# Patient Record
Sex: Female | Born: 1965 | Race: White | Hispanic: No | Marital: Married
Health system: Southern US, Community
[De-identification: ages and names within clinical notes are randomized; demographics above are authoritative.]

## PROBLEM LIST (undated history)

## (undated) HISTORY — PX: BREAST BIOPSY: SHX20

## (undated) HISTORY — PX: BREAST EXCISIONAL BIOPSY: SUR124

---

## 2014-06-15 ENCOUNTER — Other Ambulatory Visit: Payer: Self-pay

## 2014-06-15 DIAGNOSIS — R921 Mammographic calcification found on diagnostic imaging of breast: Secondary | ICD-10-CM

## 2014-06-17 ENCOUNTER — Other Ambulatory Visit: Payer: Self-pay | Admitting: Obstetrics and Gynecology

## 2014-06-17 ENCOUNTER — Other Ambulatory Visit: Payer: Self-pay

## 2014-06-17 DIAGNOSIS — R921 Mammographic calcification found on diagnostic imaging of breast: Secondary | ICD-10-CM

## 2014-06-25 ENCOUNTER — Encounter (INDEPENDENT_AMBULATORY_CARE_PROVIDER_SITE_OTHER): Payer: Self-pay

## 2014-06-25 ENCOUNTER — Ambulatory Visit
Admission: RE | Admit: 2014-06-25 | Discharge: 2014-06-25 | Disposition: A | Discharge status: Home / Self Care | Payer: 59 | Source: Ambulatory Visit

## 2014-06-25 DIAGNOSIS — R921 Mammographic calcification found on diagnostic imaging of breast: Secondary | ICD-10-CM

## 2014-10-22 ENCOUNTER — Other Ambulatory Visit: Payer: Self-pay

## 2014-10-22 DIAGNOSIS — Z1231 Encounter for screening mammogram for malignant neoplasm of breast: Secondary | ICD-10-CM

## 2014-11-02 ENCOUNTER — Ambulatory Visit
Admission: RE | Admit: 2014-11-02 | Discharge: 2014-11-02 | Disposition: A | Discharge status: Home / Self Care | Payer: BLUE CROSS/BLUE SHIELD | Source: Ambulatory Visit

## 2014-11-02 DIAGNOSIS — Z1231 Encounter for screening mammogram for malignant neoplasm of breast: Secondary | ICD-10-CM

## 2015-10-19 ENCOUNTER — Other Ambulatory Visit: Payer: Self-pay

## 2015-10-19 DIAGNOSIS — Z1231 Encounter for screening mammogram for malignant neoplasm of breast: Secondary | ICD-10-CM

## 2015-11-09 ENCOUNTER — Ambulatory Visit
Admission: RE | Admit: 2015-11-09 | Discharge: 2015-11-09 | Disposition: A | Discharge status: Home / Self Care | Payer: Managed Care, Other (non HMO) | Source: Ambulatory Visit

## 2015-11-09 DIAGNOSIS — Z1231 Encounter for screening mammogram for malignant neoplasm of breast: Secondary | ICD-10-CM

## 2016-10-18 ENCOUNTER — Other Ambulatory Visit: Payer: Self-pay | Admitting: Obstetrics

## 2016-10-18 DIAGNOSIS — Z1231 Encounter for screening mammogram for malignant neoplasm of breast: Secondary | ICD-10-CM

## 2016-10-18 DIAGNOSIS — D225 Melanocytic nevi of trunk: Secondary | ICD-10-CM | POA: Diagnosis not present

## 2016-10-30 DIAGNOSIS — E039 Hypothyroidism, unspecified: Secondary | ICD-10-CM | POA: Diagnosis not present

## 2016-10-30 DIAGNOSIS — I10 Essential (primary) hypertension: Secondary | ICD-10-CM | POA: Diagnosis not present

## 2016-11-10 ENCOUNTER — Ambulatory Visit
Admission: RE | Admit: 2016-11-10 | Discharge: 2016-11-10 | Disposition: A | Discharge status: Home / Self Care | Payer: 59 | Source: Ambulatory Visit | Attending: Obstetrics | Admitting: Obstetrics

## 2016-11-10 DIAGNOSIS — Z1231 Encounter for screening mammogram for malignant neoplasm of breast: Secondary | ICD-10-CM

## 2016-11-30 DIAGNOSIS — L03012 Cellulitis of left finger: Secondary | ICD-10-CM | POA: Diagnosis not present

## 2017-02-26 DIAGNOSIS — Z6824 Body mass index (BMI) 24.0-24.9, adult: Secondary | ICD-10-CM | POA: Diagnosis not present

## 2017-02-26 DIAGNOSIS — Z01419 Encounter for gynecological examination (general) (routine) without abnormal findings: Secondary | ICD-10-CM | POA: Diagnosis not present

## 2017-06-22 DIAGNOSIS — H04123 Dry eye syndrome of bilateral lacrimal glands: Secondary | ICD-10-CM | POA: Diagnosis not present

## 2017-07-12 DIAGNOSIS — D225 Melanocytic nevi of trunk: Secondary | ICD-10-CM | POA: Diagnosis not present

## 2017-07-12 DIAGNOSIS — B078 Other viral warts: Secondary | ICD-10-CM | POA: Diagnosis not present

## 2017-07-12 DIAGNOSIS — D2261 Melanocytic nevi of right upper limb, including shoulder: Secondary | ICD-10-CM | POA: Diagnosis not present

## 2017-07-12 DIAGNOSIS — D485 Neoplasm of uncertain behavior of skin: Secondary | ICD-10-CM | POA: Diagnosis not present

## 2017-07-12 DIAGNOSIS — Z86018 Personal history of other benign neoplasm: Secondary | ICD-10-CM | POA: Diagnosis not present

## 2017-07-12 DIAGNOSIS — Z86008 Personal history of in-situ neoplasm of other site: Secondary | ICD-10-CM | POA: Diagnosis not present

## 2017-07-21 DIAGNOSIS — Z23 Encounter for immunization: Secondary | ICD-10-CM | POA: Diagnosis not present

## 2017-10-22 ENCOUNTER — Other Ambulatory Visit: Payer: Self-pay | Admitting: Obstetrics

## 2017-10-22 DIAGNOSIS — Z139 Encounter for screening, unspecified: Secondary | ICD-10-CM

## 2017-10-29 DIAGNOSIS — Z0000 Encounter for general adult medical examination without abnormal findings: Secondary | ICD-10-CM | POA: Diagnosis not present

## 2017-10-29 DIAGNOSIS — K219 Gastro-esophageal reflux disease without esophagitis: Secondary | ICD-10-CM | POA: Diagnosis not present

## 2017-10-29 DIAGNOSIS — E039 Hypothyroidism, unspecified: Secondary | ICD-10-CM | POA: Diagnosis not present

## 2017-10-29 DIAGNOSIS — I10 Essential (primary) hypertension: Secondary | ICD-10-CM | POA: Diagnosis not present

## 2017-11-12 ENCOUNTER — Ambulatory Visit: Payer: 59

## 2017-11-16 ENCOUNTER — Ambulatory Visit
Admission: RE | Admit: 2017-11-16 | Discharge: 2017-11-16 | Disposition: A | Discharge status: Home / Self Care | Payer: 59 | Source: Ambulatory Visit | Attending: Obstetrics | Admitting: Obstetrics

## 2017-11-16 DIAGNOSIS — Z1231 Encounter for screening mammogram for malignant neoplasm of breast: Secondary | ICD-10-CM | POA: Diagnosis not present

## 2017-11-16 DIAGNOSIS — Z139 Encounter for screening, unspecified: Secondary | ICD-10-CM

## 2017-12-11 DIAGNOSIS — D485 Neoplasm of uncertain behavior of skin: Secondary | ICD-10-CM | POA: Diagnosis not present

## 2017-12-11 DIAGNOSIS — D2261 Melanocytic nevi of right upper limb, including shoulder: Secondary | ICD-10-CM | POA: Diagnosis not present

## 2018-01-03 DIAGNOSIS — D2261 Melanocytic nevi of right upper limb, including shoulder: Secondary | ICD-10-CM | POA: Diagnosis not present

## 2018-01-03 DIAGNOSIS — Z86008 Personal history of in-situ neoplasm of other site: Secondary | ICD-10-CM | POA: Diagnosis not present

## 2018-01-03 DIAGNOSIS — Z86018 Personal history of other benign neoplasm: Secondary | ICD-10-CM | POA: Diagnosis not present

## 2018-01-04 DIAGNOSIS — E039 Hypothyroidism, unspecified: Secondary | ICD-10-CM | POA: Diagnosis not present

## 2018-02-28 DIAGNOSIS — Z6824 Body mass index (BMI) 24.0-24.9, adult: Secondary | ICD-10-CM | POA: Diagnosis not present

## 2018-02-28 DIAGNOSIS — Z01419 Encounter for gynecological examination (general) (routine) without abnormal findings: Secondary | ICD-10-CM | POA: Diagnosis not present

## 2018-04-05 DIAGNOSIS — Z23 Encounter for immunization: Secondary | ICD-10-CM | POA: Diagnosis not present

## 2018-05-20 DIAGNOSIS — K635 Polyp of colon: Secondary | ICD-10-CM | POA: Diagnosis not present

## 2018-07-06 DIAGNOSIS — Z23 Encounter for immunization: Secondary | ICD-10-CM | POA: Diagnosis not present

## 2018-08-01 DIAGNOSIS — D485 Neoplasm of uncertain behavior of skin: Secondary | ICD-10-CM | POA: Diagnosis not present

## 2018-08-01 DIAGNOSIS — D225 Melanocytic nevi of trunk: Secondary | ICD-10-CM | POA: Diagnosis not present

## 2018-08-01 DIAGNOSIS — D2261 Melanocytic nevi of right upper limb, including shoulder: Secondary | ICD-10-CM | POA: Diagnosis not present

## 2018-08-01 DIAGNOSIS — D2271 Melanocytic nevi of right lower limb, including hip: Secondary | ICD-10-CM | POA: Diagnosis not present

## 2018-09-20 DIAGNOSIS — Z23 Encounter for immunization: Secondary | ICD-10-CM | POA: Diagnosis not present

## 2018-10-10 ENCOUNTER — Other Ambulatory Visit: Payer: Self-pay | Admitting: Obstetrics

## 2018-10-10 DIAGNOSIS — Z1231 Encounter for screening mammogram for malignant neoplasm of breast: Secondary | ICD-10-CM

## 2018-10-31 DIAGNOSIS — I10 Essential (primary) hypertension: Secondary | ICD-10-CM | POA: Diagnosis not present

## 2018-10-31 DIAGNOSIS — E039 Hypothyroidism, unspecified: Secondary | ICD-10-CM | POA: Diagnosis not present

## 2018-10-31 DIAGNOSIS — Z0000 Encounter for general adult medical examination without abnormal findings: Secondary | ICD-10-CM | POA: Diagnosis not present

## 2018-10-31 DIAGNOSIS — K219 Gastro-esophageal reflux disease without esophagitis: Secondary | ICD-10-CM | POA: Diagnosis not present

## 2018-11-05 ENCOUNTER — Other Ambulatory Visit: Payer: Self-pay | Admitting: Internal Medicine

## 2018-11-05 DIAGNOSIS — N183 Chronic kidney disease, stage 3 unspecified: Secondary | ICD-10-CM

## 2018-11-07 ENCOUNTER — Ambulatory Visit
Admission: RE | Admit: 2018-11-07 | Discharge: 2018-11-07 | Disposition: A | Discharge status: Home / Self Care | Payer: 59 | Source: Ambulatory Visit | Attending: Internal Medicine | Admitting: Internal Medicine

## 2018-11-07 DIAGNOSIS — N183 Chronic kidney disease, stage 3 unspecified: Secondary | ICD-10-CM

## 2018-11-12 ENCOUNTER — Ambulatory Visit
Admission: RE | Admit: 2018-11-12 | Discharge: 2018-11-12 | Disposition: A | Discharge status: Home / Self Care | Payer: 59 | Source: Ambulatory Visit | Attending: Obstetrics | Admitting: Obstetrics

## 2018-11-12 ENCOUNTER — Encounter (INDEPENDENT_AMBULATORY_CARE_PROVIDER_SITE_OTHER): Payer: Self-pay

## 2018-11-12 DIAGNOSIS — Z1231 Encounter for screening mammogram for malignant neoplasm of breast: Secondary | ICD-10-CM

## 2018-11-18 ENCOUNTER — Ambulatory Visit: Payer: 59

## 2019-09-15 ENCOUNTER — Other Ambulatory Visit: Payer: Self-pay | Admitting: Obstetrics

## 2019-09-15 DIAGNOSIS — Z1231 Encounter for screening mammogram for malignant neoplasm of breast: Secondary | ICD-10-CM

## 2019-10-08 IMAGING — MG DIGITAL SCREENING BILATERAL MAMMOGRAM WITH TOMO AND CAD
8 series · 8 of 24 positions shown · non-contrast
Comparison: Previous exam(s).

CLINICAL DATA: Screening.

EXAM:
DIGITAL SCREENING BILATERAL MAMMOGRAM WITH TOMO AND CAD

[L MLO synth-2D]
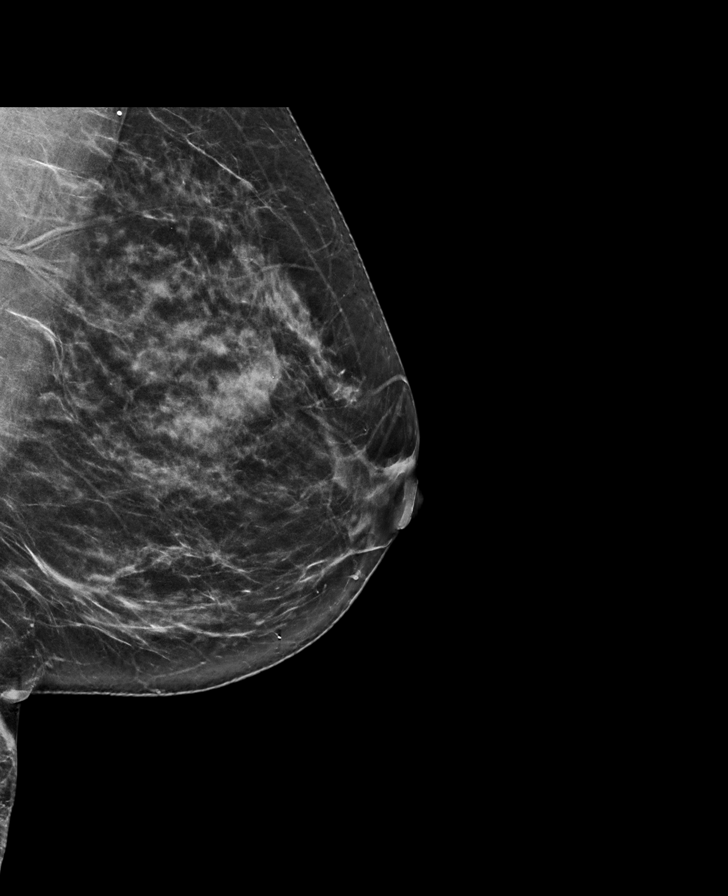

[L CC synth-2D]
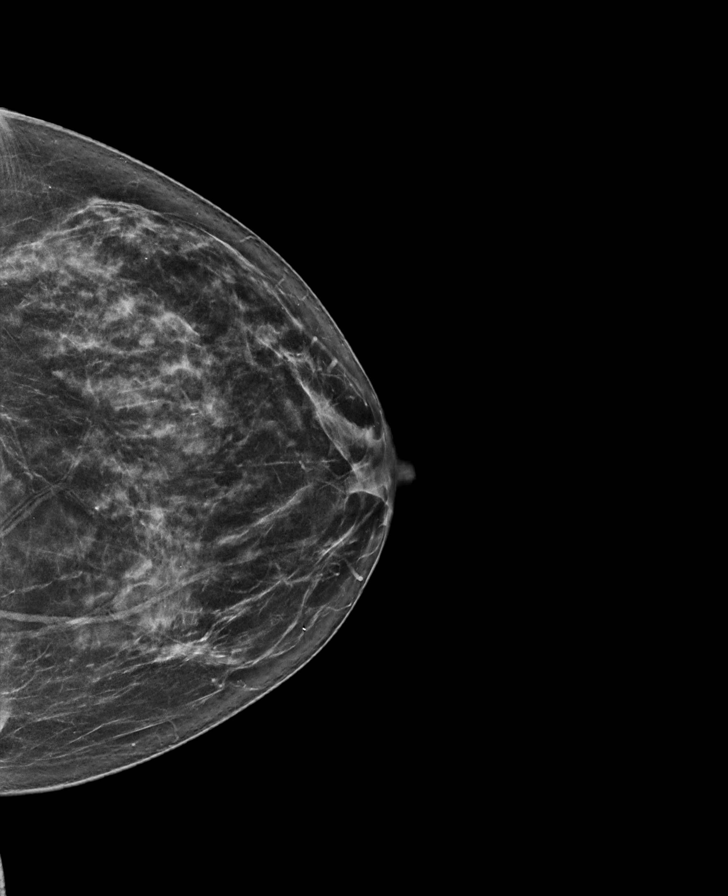

[R CC synth-2D]
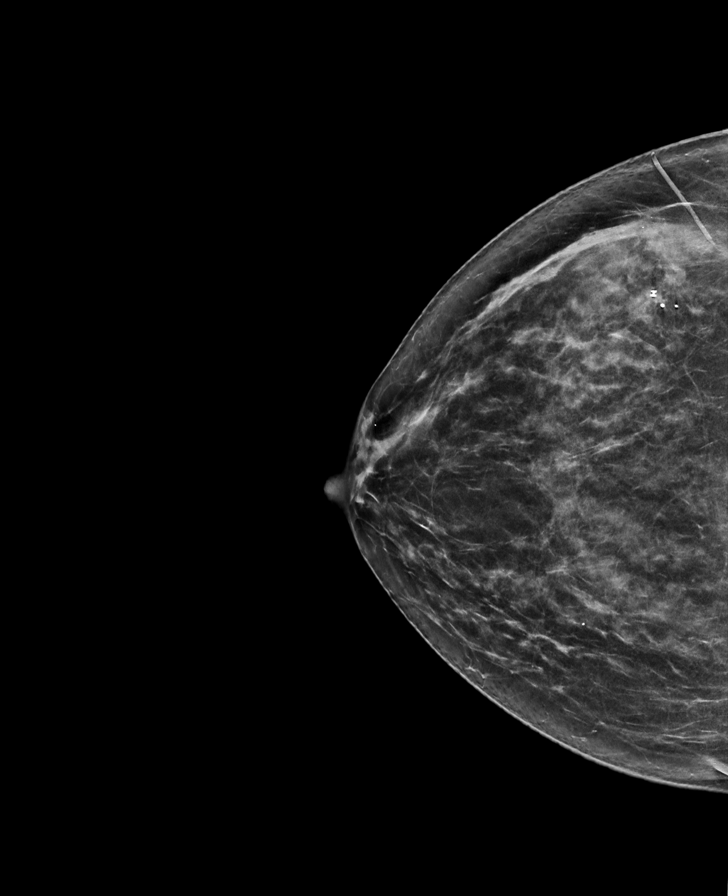

[R MLO synth-2D]
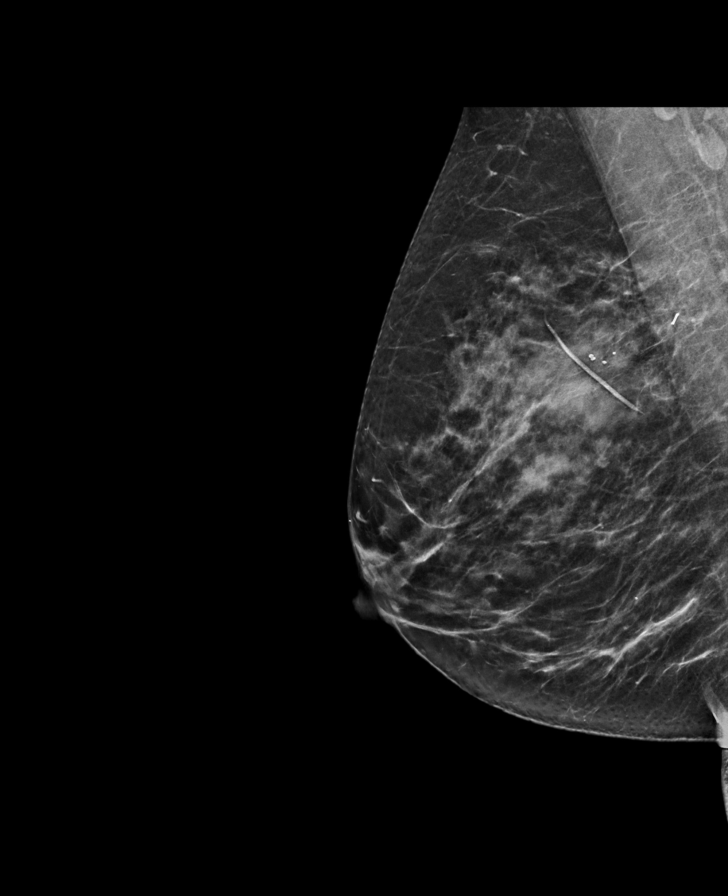

[R MLO tomo · tomo slice 35/69.0]
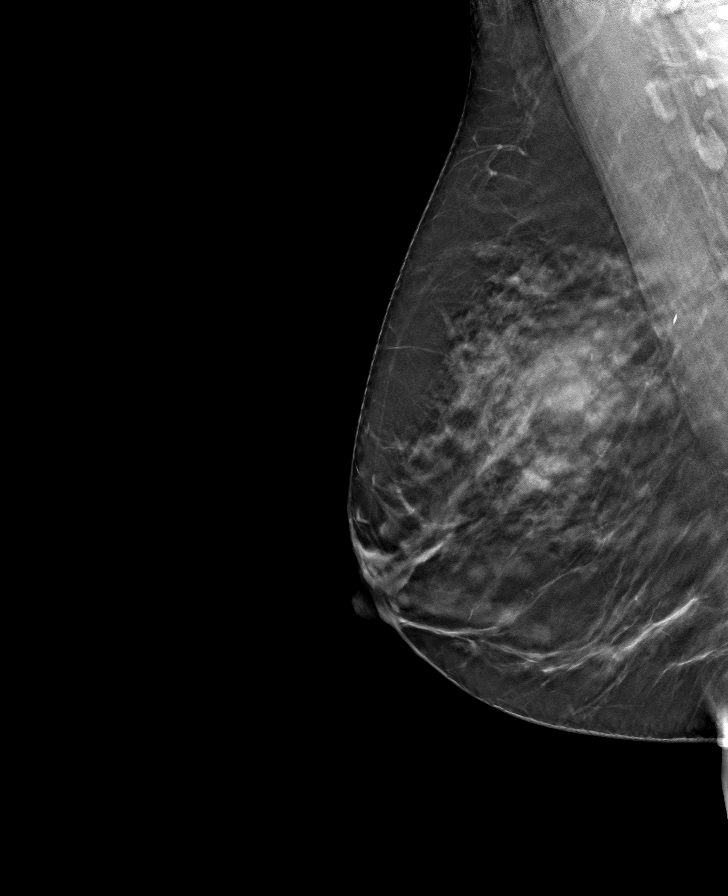

[R CC tomo · tomo slice 33/65.0]
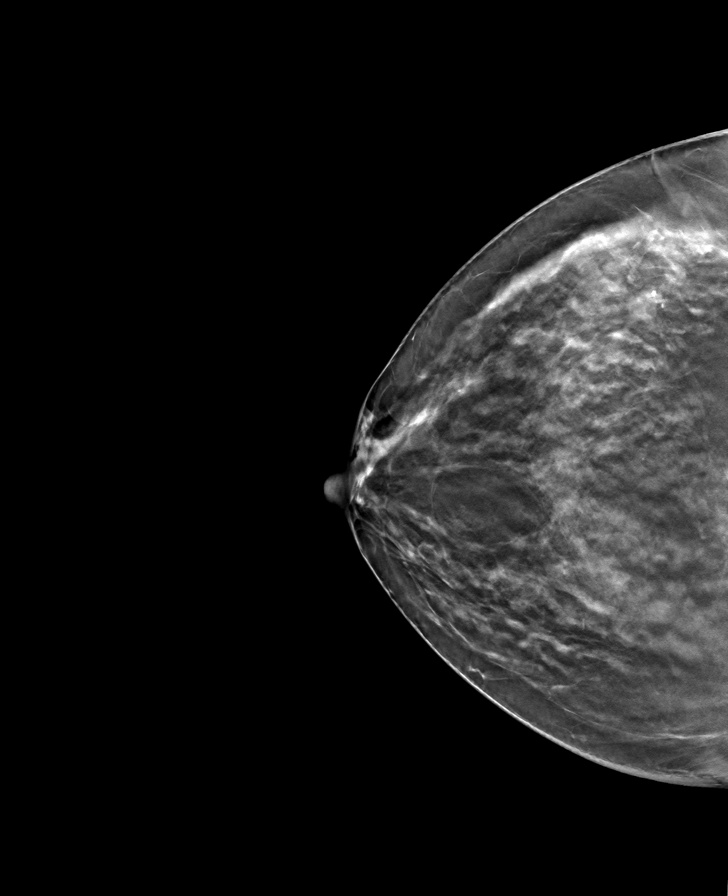

[L CC tomo · tomo slice 36/71.0]
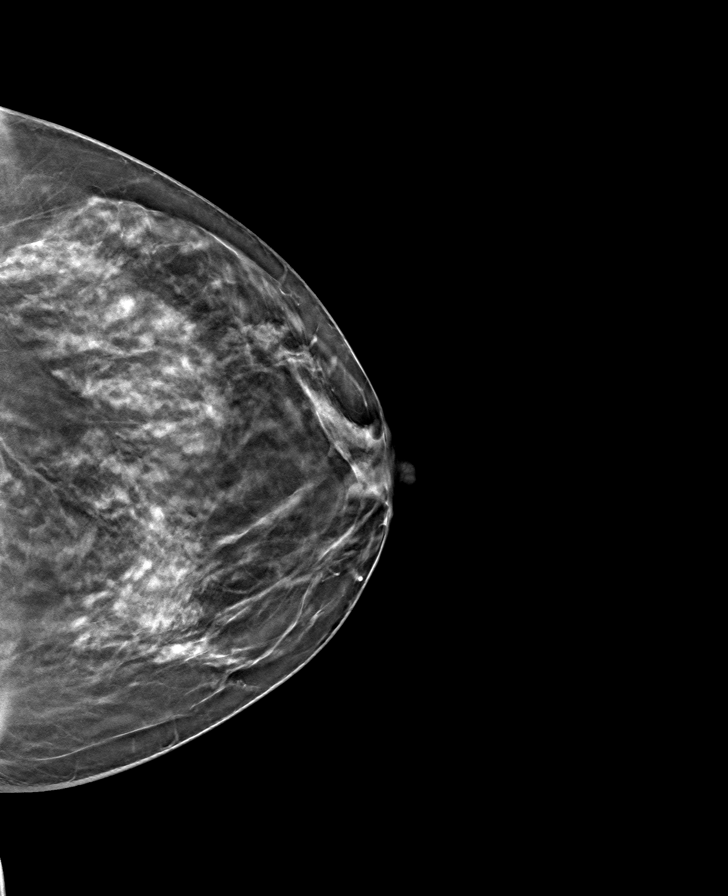

[L MLO tomo · tomo slice 35/69.0]
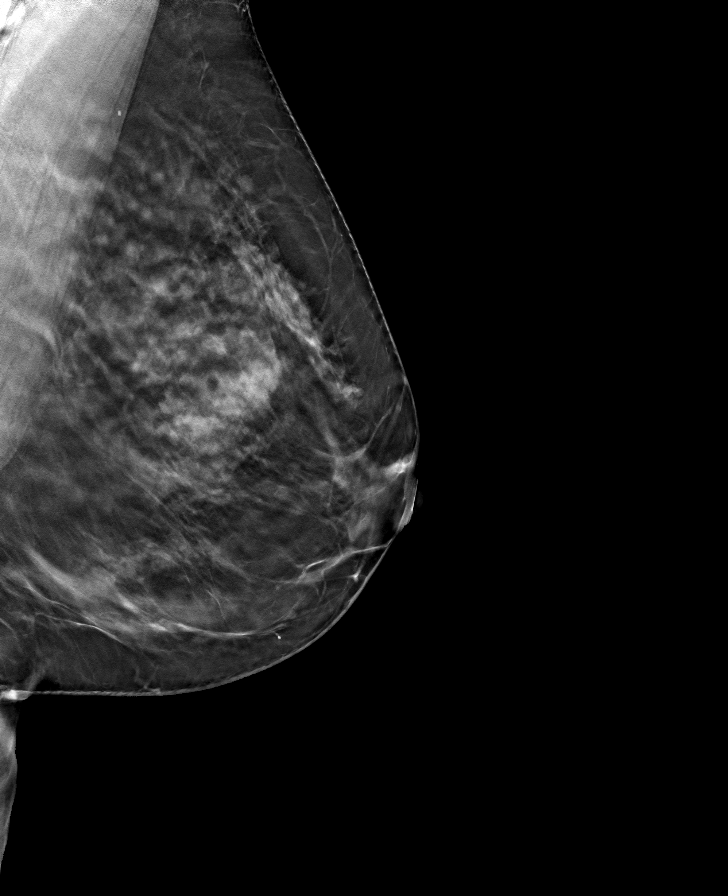

[8 of 24 positions shown; findings below may reference images not displayed]

ACR Breast Density Category c: The breast tissue is heterogeneously
dense, which may obscure small masses.
FINDINGS: There are no findings suspicious for malignancy. Images were
processed with CAD.
IMPRESSION: No mammographic evidence of malignancy. A result letter of this
screening mammogram will be mailed directly to the patient.

RECOMMENDATION:
Screening mammogram in one year. (Code:[id number])

BI-RADS CATEGORY  1: Negative.

## 2019-11-14 ENCOUNTER — Ambulatory Visit
Admission: RE | Admit: 2019-11-14 | Discharge: 2019-11-14 | Disposition: A | Discharge status: Home / Self Care | Payer: 59 | Source: Ambulatory Visit | Attending: Obstetrics | Admitting: Obstetrics

## 2019-11-14 ENCOUNTER — Other Ambulatory Visit: Payer: Self-pay

## 2019-11-14 DIAGNOSIS — Z1231 Encounter for screening mammogram for malignant neoplasm of breast: Secondary | ICD-10-CM

## 2020-10-27 ENCOUNTER — Other Ambulatory Visit: Payer: Self-pay | Admitting: Obstetrics

## 2020-10-27 DIAGNOSIS — Z1231 Encounter for screening mammogram for malignant neoplasm of breast: Secondary | ICD-10-CM

## 2020-11-02 DIAGNOSIS — Z1231 Encounter for screening mammogram for malignant neoplasm of breast: Secondary | ICD-10-CM

## 2020-11-03 ENCOUNTER — Other Ambulatory Visit: Payer: Self-pay

## 2020-11-03 ENCOUNTER — Ambulatory Visit
Admission: RE | Admit: 2020-11-03 | Discharge: 2020-11-03 | Disposition: A | Discharge status: Home / Self Care | Payer: Managed Care, Other (non HMO) | Source: Ambulatory Visit | Attending: Obstetrics | Admitting: Obstetrics

## 2020-11-03 DIAGNOSIS — Z1231 Encounter for screening mammogram for malignant neoplasm of breast: Secondary | ICD-10-CM

## 2021-07-27 ENCOUNTER — Other Ambulatory Visit: Payer: Self-pay | Admitting: Obstetrics

## 2021-07-27 DIAGNOSIS — Z1231 Encounter for screening mammogram for malignant neoplasm of breast: Secondary | ICD-10-CM

## 2021-10-24 ENCOUNTER — Ambulatory Visit
Admission: RE | Admit: 2021-10-24 | Discharge: 2021-10-24 | Disposition: A | Discharge status: Home / Self Care | Payer: Managed Care, Other (non HMO) | Source: Ambulatory Visit

## 2021-10-24 DIAGNOSIS — Z1231 Encounter for screening mammogram for malignant neoplasm of breast: Secondary | ICD-10-CM

## 2022-08-21 ENCOUNTER — Other Ambulatory Visit: Payer: Self-pay | Admitting: Internal Medicine

## 2022-08-21 DIAGNOSIS — Z1231 Encounter for screening mammogram for malignant neoplasm of breast: Secondary | ICD-10-CM

## 2022-10-25 ENCOUNTER — Ambulatory Visit
Admission: RE | Admit: 2022-10-25 | Discharge: 2022-10-25 | Disposition: A | Discharge status: Home / Self Care | Payer: 59 | Source: Ambulatory Visit

## 2022-10-25 DIAGNOSIS — Z1231 Encounter for screening mammogram for malignant neoplasm of breast: Secondary | ICD-10-CM

## 2023-09-21 ENCOUNTER — Other Ambulatory Visit: Payer: Self-pay | Admitting: Obstetrics

## 2023-09-21 DIAGNOSIS — Z1231 Encounter for screening mammogram for malignant neoplasm of breast: Secondary | ICD-10-CM

## 2023-10-29 ENCOUNTER — Ambulatory Visit
Admission: RE | Admit: 2023-10-29 | Discharge: 2023-10-29 | Disposition: A | Discharge status: Home / Self Care | Payer: 59 | Source: Ambulatory Visit

## 2023-10-29 DIAGNOSIS — Z1231 Encounter for screening mammogram for malignant neoplasm of breast: Secondary | ICD-10-CM

## 2024-09-22 ENCOUNTER — Other Ambulatory Visit: Payer: Self-pay | Admitting: Internal Medicine

## 2024-09-22 DIAGNOSIS — Z1231 Encounter for screening mammogram for malignant neoplasm of breast: Secondary | ICD-10-CM

## 2024-10-21 ENCOUNTER — Ambulatory Visit: Admission: RE | Admit: 2024-10-21 | Discharge: 2024-10-21 | Disposition: A | Payer: Self-pay | Source: Ambulatory Visit

## 2024-10-21 DIAGNOSIS — Z1231 Encounter for screening mammogram for malignant neoplasm of breast: Secondary | ICD-10-CM

## 2024-10-23 ENCOUNTER — Other Ambulatory Visit: Payer: Self-pay | Admitting: Obstetrics

## 2024-10-23 DIAGNOSIS — N649 Disorder of breast, unspecified: Secondary | ICD-10-CM

## 2024-10-23 DIAGNOSIS — R928 Other abnormal and inconclusive findings on diagnostic imaging of breast: Secondary | ICD-10-CM

## 2024-10-28 ENCOUNTER — Inpatient Hospital Stay: Admission: RE | Admit: 2024-10-28 | Discharge: 2024-10-28 | Attending: Obstetrics | Admitting: Obstetrics

## 2024-10-28 DIAGNOSIS — R928 Other abnormal and inconclusive findings on diagnostic imaging of breast: Secondary | ICD-10-CM
